# Patient Record
Sex: Male | Born: 1985 | Race: Black or African American | Hispanic: No | Marital: Single | Smoking: Never smoker
Health system: Southern US, Community
[De-identification: ages and names within clinical notes are randomized; demographics above are authoritative.]

## PROBLEM LIST (undated history)

## (undated) DIAGNOSIS — R7303 Prediabetes: Secondary | ICD-10-CM

## (undated) HISTORY — DX: Prediabetes: R73.03

## (undated) HISTORY — PX: STRABISMUS SURGERY: SHX218

---

## 2014-07-02 ENCOUNTER — Emergency Department (HOSPITAL_COMMUNITY): Payer: Self-pay

## 2014-07-02 ENCOUNTER — Emergency Department (HOSPITAL_COMMUNITY)
Admission: EM | Admit: 2014-07-02 | Discharge: 2014-07-02 | Disposition: A | Discharge status: Home / Self Care | Payer: Self-pay | Attending: Emergency Medicine | Admitting: Emergency Medicine

## 2014-07-02 ENCOUNTER — Encounter (HOSPITAL_COMMUNITY): Payer: Self-pay | Admitting: *Deleted

## 2014-07-02 DIAGNOSIS — Y9389 Activity, other specified: Secondary | ICD-10-CM | POA: Insufficient documentation

## 2014-07-02 DIAGNOSIS — S30811A Abrasion of abdominal wall, initial encounter: Secondary | ICD-10-CM | POA: Insufficient documentation

## 2014-07-02 DIAGNOSIS — Y998 Other external cause status: Secondary | ICD-10-CM | POA: Insufficient documentation

## 2014-07-02 DIAGNOSIS — S80211A Abrasion, right knee, initial encounter: Secondary | ICD-10-CM | POA: Insufficient documentation

## 2014-07-02 DIAGNOSIS — Z23 Encounter for immunization: Secondary | ICD-10-CM | POA: Insufficient documentation

## 2014-07-02 DIAGNOSIS — Y92410 Unspecified street and highway as the place of occurrence of the external cause: Secondary | ICD-10-CM | POA: Insufficient documentation

## 2014-07-02 DIAGNOSIS — S0001XA Abrasion of scalp, initial encounter: Secondary | ICD-10-CM | POA: Insufficient documentation

## 2014-07-02 DIAGNOSIS — S60511A Abrasion of right hand, initial encounter: Secondary | ICD-10-CM | POA: Insufficient documentation

## 2014-07-02 DIAGNOSIS — S40211A Abrasion of right shoulder, initial encounter: Secondary | ICD-10-CM | POA: Insufficient documentation

## 2014-07-02 DIAGNOSIS — S50311A Abrasion of right elbow, initial encounter: Secondary | ICD-10-CM | POA: Insufficient documentation

## 2014-07-02 DIAGNOSIS — T148XXA Other injury of unspecified body region, initial encounter: Secondary | ICD-10-CM

## 2014-07-02 MED ORDER — OXYCODONE-ACETAMINOPHEN 5-325 MG PO TABS: 1.0000 | ORAL_TABLET | Freq: Four times a day (QID) | ORAL | Status: DC | PRN

## 2014-07-02 MED ORDER — TETANUS-DIPHTH-ACELL PERTUSSIS 5-2.5-18.5 LF-MCG/0.5 IM SUSP
0.5000 mL | Freq: Once | INTRAMUSCULAR | Status: AC
  Administered 2014-07-02: 0.5 mL via INTRAMUSCULAR
  Filled 2014-07-02: qty 0.5

## 2014-07-02 MED ORDER — OXYCODONE-ACETAMINOPHEN 5-325 MG PO TABS
2.0000 | ORAL_TABLET | Freq: Once | ORAL | Status: AC
  Administered 2014-07-02: 2 via ORAL
  Filled 2014-07-02: qty 2

## 2014-07-02 MED ORDER — KETOROLAC TROMETHAMINE 60 MG/2ML IM SOLN
60.0000 mg | Freq: Once | INTRAMUSCULAR | Status: AC
  Administered 2014-07-02: 60 mg via INTRAMUSCULAR
  Filled 2014-07-02: qty 2

## 2014-07-02 NOTE — Discharge Instructions (Signed)

## 2014-07-02 NOTE — ED Notes (Signed)
Wounds cleaned and irrigated with sterile water and left open to air.
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
## 2014-07-02 NOTE — ED Notes (Signed)
Pt verbalized understanding of prescription use and dressing changes. NO further questions. Pt in NAD.

## 2014-07-02 NOTE — ED Notes (Signed)
All wounds thoroughly cleaned and non adhesive pads with bacitracin applied.

## 2017-12-21 ENCOUNTER — Other Ambulatory Visit: Payer: Self-pay

## 2017-12-21 ENCOUNTER — Encounter (HOSPITAL_COMMUNITY): Payer: Self-pay

## 2017-12-21 ENCOUNTER — Ambulatory Visit (HOSPITAL_COMMUNITY)
Admission: EM | Admit: 2017-12-21 | Discharge: 2017-12-21 | Disposition: A | Discharge status: Home / Self Care | Payer: Self-pay | Attending: Family Medicine | Admitting: Family Medicine

## 2017-12-21 DIAGNOSIS — K2900 Acute gastritis without bleeding: Secondary | ICD-10-CM | POA: Insufficient documentation

## 2017-12-21 DIAGNOSIS — R112 Nausea with vomiting, unspecified: Secondary | ICD-10-CM

## 2017-12-21 MED ORDER — ONDANSETRON HCL 4 MG/2ML IJ SOLN
4.0000 mg | Freq: Once | INTRAMUSCULAR | Status: AC
  Administered 2017-12-21: 4 mg via INTRAMUSCULAR

## 2017-12-21 MED ORDER — ONDANSETRON HCL 4 MG/2ML IJ SOLN
INTRAMUSCULAR | Status: AC
  Filled 2017-12-21: qty 2

## 2017-12-21 MED ORDER — ONDANSETRON 4 MG PO TBDP: 4.0000 mg | ORAL_TABLET | Freq: Three times a day (TID) | ORAL | 0 refills | Status: DC | PRN

## 2017-12-21 NOTE — ED Triage Notes (Signed)
Pt presents today with nausea, vomiting, and abdominal pain. States he did drink alcohol last night but not more than usual. Not able to keep anything on his stomach.

## 2017-12-21 NOTE — ED Provider Notes (Signed)
[facility]    CSN: [id number] Arrival date & time: 12/21/17  1321     History   Chief Complaint Chief Complaint  Patient presents with  . Abdominal Pain    HPI Alan Harding is a 32 y.o. male.   Patient is a 32 year old male who presents for nausea, vomiting start this morning.  His symptoms been constant remain the same.  He has had multiple episodes of bilious vomiting.  No diarrhea.  Denies any fever, chills.  Denies any specific abdominal pain.  He did go to a Christmas party last night where he admits to having a few drinks and eating.  He did not drink more alcohol than usual.  He is unsure if anybody was sick at the party.  No recent traveling.  ROS per HPI      History reviewed. No pertinent past medical history.  There are no active problems to display for this patient.   History reviewed. No pertinent surgical history.     Home Medications    Prior to Admission medications   Medication Sig Start Date End Date Taking? Authorizing Provider  ondansetron (ZOFRAN ODT) 4 MG disintegrating tablet Take 1 tablet (4 mg total) by mouth every 8 (eight) hours as needed for nausea or vomiting. 12/21/17   Janace ArisBast, Arianna Haydon A, NP    Family History No family history on file.  Social History Social History   Tobacco Use  . Smoking status: Never Smoker  . Smokeless tobacco: Never Used  Substance Use Topics  . Alcohol use: Yes  . Drug use: No     Allergies   Patient has no known allergies.   Review of Systems Review of Systems   Physical Exam Triage Vital Signs ED Triage Vitals  Enc Vitals Group     BP 12/21/17 1412 (!) 141/75     Pulse Rate 12/21/17 1412 85     Resp 12/21/17 1412 16     Temp 12/21/17 1412 97.9 F (36.6 C)     Temp Source 12/21/17 1412 Oral     SpO2 12/21/17 1412 98 %     Weight --      Height --      Head Circumference --      Peak Flow --      Pain Score 12/21/17 1410 0     Pain Loc --      Pain Edu? --      Excl.  in [place]? --    No data found.  Updated Vital Signs BP (!) 141/75 (BP Location: Right Arm)   Pulse 85   Temp 97.9 F (36.6 C) (Oral)   Resp 16   SpO2 98%   Visual Acuity Right Eye Distance:   Left Eye Distance:   Bilateral Distance:    Right Eye Near:   Left Eye Near:    Bilateral Near:     Physical Exam Vitals signs and nursing note reviewed.  Constitutional:      Appearance: He is well-developed and normal weight.  HENT:     Head: Normocephalic and atraumatic.  Cardiovascular:     Rate and Rhythm: Normal rate and regular rhythm.  Pulmonary:     Effort: Pulmonary effort is normal.  Abdominal:     General: Abdomen is flat. Bowel sounds are increased.     Palpations: Abdomen is soft. There is no shifting dullness, fluid wave, hepatomegaly, splenomegaly, mass or pulsatile mass.     Tenderness: There is generalized abdominal tenderness.  Skin:    General: Skin is warm and dry.  Neurological:     General: No focal deficit present.     Mental Status: He is alert.  Psychiatric:        Mood and Affect: Mood normal.      UC Treatments / Results  Labs (all labs ordered are listed, but only abnormal results are displayed) Labs Reviewed - No data to display  EKG None  Radiology No results found.  Procedures Procedures (including critical care time)  Medications Ordered in UC Medications  ondansetron (ZOFRAN) injection 4 mg (4 mg Intramuscular Given 12/21/17 1433)    Initial Impression / Assessment and Plan / UC Course  I have reviewed the triage vital signs and the nursing notes.  Pertinent labs & imaging results that were available during my care of the patient were reviewed by me and considered in my medical decision making (see chart for details).     Patient is a 32 year old male who is otherwise healthy.  He went to Christmas party last, had dinner and a few drinks.  Reports that he did not drink more than normal.  Patient is complaining of vague  abdominal pain with nausea, vomiting since this morning.  Zofran 4 mg IM given in clinic 30 minutes later patient reassessed and feeling much better.  Denies any nausea. Most likely symptoms are related to gastritis We will send him home with prescription for Zofran to use as needed Small sips of fluids to stay hydrated Advance diet as tolerated For continued or worsening symptoms please go to the hospital. Final Clinical Impressions(s) / UC Diagnoses   Final diagnoses:  Acute gastritis without hemorrhage, unspecified gastritis type     Discharge Instructions     I am glad that you are feeling better from the medication we gave you Continue to stay hydrated with Gatorade and water. You can use the Zofran as needed for nausea, vomiting For continued or worsening symptoms please follow-up or go to the hospital.    ED Prescriptions    Medication Sig Dispense Auth. Provider   ondansetron (ZOFRAN ODT) 4 MG disintegrating tablet Take 1 tablet (4 mg total) by mouth every 8 (eight) hours as needed for nausea or vomiting. 20 tablet Dahlia Byes A, NP     Controlled Substance Prescriptions [place] Controlled Substance Registry consulted? Not Applicable   Janace Aris, NP 12/22/17 1122

## 2017-12-21 NOTE — Discharge Instructions (Addendum)
I am glad that you are feeling better from the medication we gave you Continue to stay hydrated with Gatorade and water. You can use the Zofran as needed for nausea, vomiting For continued or worsening symptoms please follow-up or go to the hospital.

## 2018-12-21 ENCOUNTER — Other Ambulatory Visit: Payer: Self-pay

## 2018-12-21 ENCOUNTER — Encounter (HOSPITAL_COMMUNITY): Payer: Self-pay | Admitting: Emergency Medicine

## 2018-12-21 ENCOUNTER — Ambulatory Visit (HOSPITAL_COMMUNITY)
Admission: EM | Admit: 2018-12-21 | Discharge: 2018-12-21 | Disposition: A | Discharge status: Home / Self Care | Payer: BC Managed Care – PPO | Attending: Physician Assistant | Admitting: Physician Assistant

## 2018-12-21 DIAGNOSIS — S61012A Laceration without foreign body of left thumb without damage to nail, initial encounter: Secondary | ICD-10-CM

## 2018-12-21 NOTE — ED Provider Notes (Signed)
[facility]    CSN: [id number] Arrival date & time: 12/21/18  1921      History   Chief Complaint Chief Complaint  Patient presents with  . Laceration    left thumb    HPI Alan Harding is a 33 y.o. male.   Patient reports to urgent care today for laceration to left thumb this morning with a box cutter. He denies numbness, tingling or large blood loss. He reports ability to move thumb without issue.      History reviewed. No pertinent past medical history.  There are no problems to display for this patient.   History reviewed. No pertinent surgical history.     Home Medications    Prior to Admission medications   Medication Sig Start Date End Date Taking? Authorizing Provider  ondansetron (ZOFRAN ODT) 4 MG disintegrating tablet Take 1 tablet (4 mg total) by mouth every 8 (eight) hours as needed for nausea or vomiting. 12/21/17   Orvan July, NP    Family History History reviewed. No pertinent family history.  Social History Social History   Tobacco Use  . Smoking status: Never Smoker  . Smokeless tobacco: Never Used  Substance Use Topics  . Alcohol use: Yes  . Drug use: No     Allergies   Patient has no known allergies.   Review of Systems Review of Systems  Constitutional: Negative for fever.  Musculoskeletal: Negative for joint swelling.  Neurological: Negative for dizziness and light-headedness.     Physical Exam Triage Vital Signs ED Triage Vitals [12/21/18 2017]  Enc Vitals Group     BP      Pulse      Resp      Temp      Temp src      SpO2      Weight      Height      Head Circumference      Peak Flow      Pain Score 0     Pain Loc      Pain Edu?      Excl. in [place]?    No data found.  Updated Vital Signs BP (!) 149/91 (BP Location: Right Arm)   Pulse 71   Temp 99 F (37.2 C) (Oral)   Resp 12   SpO2 98%   Visual Acuity Right Eye Distance:   Left Eye Distance:   Bilateral Distance:    Right Eye  Near:   Left Eye Near:    Bilateral Near:     Physical Exam Constitutional:      General: He is not in acute distress.    Appearance: Normal appearance. He is normal weight.  HENT:     Head: Normocephalic and atraumatic.  Eyes:     Pupils: Pupils are equal, round, and reactive to light.  Musculoskeletal:       Arms:     Comments: 1cm x 26mm deep clean lacertation to left thumb. No foreign body present, no sign of tendon involvement. Cap refill <2 seconds. Sensation intact. Full ROM.  Skin:    Capillary Refill: Capillary refill takes less than 2 seconds.  Neurological:     General: No focal deficit present.     Mental Status: He is alert and oriented to person, place, and time.  Psychiatric:        Mood and Affect: Mood normal.        Behavior: Behavior normal.  Thought Content: Thought content normal.        Judgment: Judgment normal.      UC Treatments / Results  Labs (all labs ordered are listed, but only abnormal results are displayed) Labs Reviewed - No data to display  EKG   Radiology No results found.  Procedures Laceration Repair  Date/Time: 12/21/2018 8:21 PM Performed by: Hermelinda Medicus, PA-C Authorized by: Hermelinda Medicus, PA-C   Consent:    Consent obtained:  Verbal   Consent given by:  Patient   Risks discussed:  Infection, pain and poor wound healing   Alternatives discussed:  No treatment Anesthesia (see MAR for exact dosages):    Anesthesia method:  Local infiltration   Local anesthetic:  Lidocaine 2% w/o epi Laceration details:    Location:  Finger   Finger location:  L thumb   Length (cm):  1   Depth (mm):  5 Repair type:    Repair type:  Simple Pre-procedure details:    Preparation:  Patient was prepped and draped in usual sterile fashion Exploration:    Hemostasis achieved with:  Direct pressure   Wound exploration: wound explored through full range of motion     Contaminated: no   Treatment:    Area cleansed with:  Saline    Amount of cleaning:  Standard   Irrigation solution:  Sterile saline   Irrigation volume:  73ml   Irrigation method:  Syringe   Visualized foreign bodies/material removed: no   Skin repair:    Repair method:  Sutures   Suture size:  4-0   Suture material:  Prolene   Suture technique:  Simple interrupted   Number of sutures:  2 Approximation:    Approximation:  Close Post-procedure details:    Dressing:  Antibiotic ointment and bulky dressing   Patient tolerance of procedure:  Tolerated well, no immediate complications   (including critical care time)  Medications Ordered in UC Medications - No data to display  Initial Impression / Assessment and Plan / UC Course  I have reviewed the triage vital signs and the nursing notes.  Pertinent labs & imaging results that were available during my care of the patient were reviewed by me and considered in my medical decision making (see chart for details).     Left Thumb laceration- 1cm x 21mm - no tendon involvement. Good ROM, Neurovascular intact. Simple closure with 2 x simple interrupted 4-0 prolene sutures. Instructed to keep bandage in place x 24 hrs and follow wound care instructions. Return in 7-10 days for removal.  Final Clinical Impressions(s) / UC Diagnoses   Final diagnoses:  Laceration of left thumb without foreign body without damage to nail, initial encounter     Discharge Instructions     Keep your wound/sutures clean and covered for 24 hours. Utilize wound care instructions attached.  Follow up with your primary care provider or return to this clinic if 7-10 days for suture removal.  If you notice redness, discharge, suddenly increasing pain at the wound site or develop a fever seek care at urgent care or an emergency department    ED Prescriptions    None     PDMP not reviewed this encounter.   Hermelinda Medicus, PA-C 12/21/18 [phone]

## 2018-12-21 NOTE — Discharge Instructions (Addendum)
Keep your wound/sutures clean and covered for 24 hours. Utilize wound care instructions attached.  Follow up with your primary care provider or return to this clinic if 7-10 days for suture removal.  If you notice redness, discharge, suddenly increasing pain at the wound site or develop a fever seek care at urgent care or an emergency department

## 2018-12-21 NOTE — ED Triage Notes (Signed)
Pt cut his left thumb with a box cutter at home this morning.  Pt is giving very limited information.

## 2019-03-12 ENCOUNTER — Ambulatory Visit: Payer: BC Managed Care – PPO | Attending: Internal Medicine

## 2019-03-12 DIAGNOSIS — Z23 Encounter for immunization: Secondary | ICD-10-CM | POA: Insufficient documentation

## 2019-03-12 NOTE — Progress Notes (Signed)
   Covid-19 Vaccination Clinic  Name:  Alan Harding    MRN: [id number] DOB: Sep 12, 1985  03/12/2019  Alan Harding was observed post Covid-19 immunization for 15 minutes without incident. He was provided with Vaccine Information Sheet and instruction to access the V-Safe system.   Alan Harding was instructed to call 911 with any severe reactions post vaccine: Marland Kitchen Difficulty breathing  . Swelling of face and throat  . A fast heartbeat  . A bad rash all over body  . Dizziness and weakness

## 2019-04-12 ENCOUNTER — Ambulatory Visit: Payer: BC Managed Care – PPO

## 2019-04-13 ENCOUNTER — Ambulatory Visit: Payer: BC Managed Care – PPO | Attending: Internal Medicine

## 2019-04-13 DIAGNOSIS — Z23 Encounter for immunization: Secondary | ICD-10-CM

## 2019-04-13 NOTE — Progress Notes (Signed)
   Covid-19 Vaccination Clinic  Name:  Cyree Chuong    MRN: [id number] DOB: 07/11/1985  04/13/2019  Mr. Campione was observed post Covid-19 immunization for 15 minutes without incident. He was provided with Vaccine Information Sheet and instruction to access the V-Safe system.   Mr. Fosdick was instructed to call 911 with any severe reactions post vaccine: Marland Kitchen Difficulty breathing  . Swelling of face and throat  . A fast heartbeat  . A bad rash all over body  . Dizziness and weakness   Immunizations Administered    Name Date Dose VIS Date Route   Pfizer COVID-19 Vaccine 04/13/2019 11:28 AM 0.3 mL 12/18/2018 Intramuscular   Manufacturer: [employer], [employer]   Lot: [id number]   NDC: [address]-2

## 2019-11-09 ENCOUNTER — Ambulatory Visit: Payer: BC Managed Care – PPO

## 2019-11-20 ENCOUNTER — Ambulatory Visit: Payer: BC Managed Care – PPO | Attending: Internal Medicine

## 2019-11-20 DIAGNOSIS — Z23 Encounter for immunization: Secondary | ICD-10-CM

## 2019-11-20 NOTE — Progress Notes (Signed)
   Covid-19 Vaccination Clinic  Name:  Alan Harding    MRN: [id number] DOB: [birth year]  11/20/2019  [name] was observed post Covid-19 immunization for 15 minutes without incident. He was provided with Vaccine Information Sheet and instruction to access the V-Safe system.   [name] was instructed to call 911 with any severe reactions post vaccine: Marland Kitchen Difficulty breathing  . Swelling of face and throat  . A fast heartbeat  . A bad rash all over body  . Dizziness and weakness   Immunizations Administered    Name Date Dose VIS Date Route   Pfizer COVID-19 Vaccine 11/20/2019 11:59 AM 0.3 mL 10/27/2019 Intramuscular   Manufacturer: [employer], [employer]   Lot: [id number]   NDC: [address]-3

## 2020-08-28 LAB — HM HEPATITIS C SCREENING LAB: HM Hepatitis Screen: NEGATIVE

## 2021-05-13 DIAGNOSIS — Z7251 High risk heterosexual behavior: Secondary | ICD-10-CM | POA: Diagnosis not present

## 2021-05-13 DIAGNOSIS — Z202 Contact with and (suspected) exposure to infections with a predominantly sexual mode of transmission: Secondary | ICD-10-CM | POA: Diagnosis not present

## 2021-05-15 DIAGNOSIS — Z202 Contact with and (suspected) exposure to infections with a predominantly sexual mode of transmission: Secondary | ICD-10-CM | POA: Diagnosis not present

## 2021-05-15 DIAGNOSIS — Z7251 High risk heterosexual behavior: Secondary | ICD-10-CM | POA: Diagnosis not present

## 2021-05-28 NOTE — Progress Notes (Unsigned)
   New Patient Office Visit  Subjective    Patient ID: Alan Harding, male    DOB: 05-25-1985  Age: 36 y.o. MRN: [id number]  CC: No chief complaint on file.   HPI Alan Harding presents for new patient visit to establish care.  Introduced to Publishing rights manager role and practice setting.  All questions answered.  Discussed provider/patient relationship and expectations.   Outpatient Encounter Medications as of 05/29/2021  Medication Sig   ondansetron (ZOFRAN ODT) 4 MG disintegrating tablet Take 1 tablet (4 mg total) by mouth every 8 (eight) hours as needed for nausea or vomiting.   No facility-administered encounter medications on file as of 05/29/2021.    No past medical history on file.  No past surgical history on file.  No family history on file.  Social History   Socioeconomic History   Marital status: Single    Spouse name: Not on file   Number of children: Not on file   Years of education: Not on file   Highest education level: Not on file  Occupational History   Not on file  Tobacco Use   Smoking status: Never   Smokeless tobacco: Never  Substance and Sexual Activity   Alcohol use: Yes   Drug use: No   Sexual activity: Not on file  Other Topics Concern   Not on file  Social History Narrative   Not on file   Social Determinants of Health   Financial Resource Strain: Not on file  Food Insecurity: Not on file  Transportation Needs: Not on file  Physical Activity: Not on file  Stress: Not on file  Social Connections: Not on file  Intimate Partner Violence: Not on file    ROS      Objective    There were no vitals taken for this visit.  Physical Exam  {Labs (Optional):23779}    Assessment & Plan:   Problem List Items Addressed This Visit   None   No follow-ups on file.   Gerre Scull, NP

## 2021-05-29 ENCOUNTER — Ambulatory Visit: Payer: BC Managed Care – PPO | Admitting: Nurse Practitioner

## 2021-05-29 ENCOUNTER — Encounter: Payer: Self-pay | Admitting: Nurse Practitioner

## 2021-05-29 VITALS — BP 129/86 | HR 74 | Temp 97.7°F | Ht 74.0 in | Wt 183.0 lb

## 2021-05-29 DIAGNOSIS — R202 Paresthesia of skin: Secondary | ICD-10-CM | POA: Diagnosis not present

## 2021-05-29 DIAGNOSIS — R200 Anesthesia of skin: Secondary | ICD-10-CM

## 2021-05-29 DIAGNOSIS — R102 Pelvic and perineal pain: Secondary | ICD-10-CM | POA: Insufficient documentation

## 2021-05-29 LAB — CBC WITH DIFFERENTIAL/PLATELET
Basophils Absolute: 0 10*3/uL (ref 0.0–0.1)
Basophils Relative: 0.3 % (ref 0.0–3.0)
Eosinophils Absolute: 0 10*3/uL (ref 0.0–0.7)
Eosinophils Relative: 0.3 % (ref 0.0–5.0)
HCT: 42.8 % (ref 39.0–52.0)
Hemoglobin: 14.2 g/dL (ref 13.0–17.0)
Lymphocytes Relative: 23.8 % (ref 12.0–46.0)
Lymphs Abs: 1.7 10*3/uL (ref 0.7–4.0)
MCHC: 33.1 g/dL (ref 30.0–36.0)
MCV: 95 fl (ref 78.0–100.0)
Monocytes Absolute: 0.6 10*3/uL (ref 0.1–1.0)
Monocytes Relative: 7.8 % (ref 3.0–12.0)
Neutro Abs: 4.8 10*3/uL (ref 1.4–7.7)
Neutrophils Relative %: 67.8 % (ref 43.0–77.0)
Platelets: 258 10*3/uL (ref 150.0–400.0)
RBC: 4.5 Mil/uL (ref 4.22–5.81)
RDW: 14.5 % (ref 11.5–15.5)
WBC: 7.1 10*3/uL (ref 4.0–10.5)

## 2021-05-29 LAB — LIPID PANEL
Cholesterol: 214 mg/dL — ABNORMAL HIGH (ref 0–200)
HDL: 86.1 mg/dL (ref >39.00)
LDL Cholesterol: 100 mg/dL — ABNORMAL HIGH (ref 0–99)
NonHDL: 128.01
Total CHOL/HDL Ratio: 2
Triglycerides: 139 mg/dL (ref 0.0–149.0)
VLDL: 27.8 mg/dL (ref 0.0–40.0)

## 2021-05-29 LAB — TSH: TSH: 0.9 u[IU]/mL (ref 0.35–5.50)

## 2021-05-29 LAB — COMPREHENSIVE METABOLIC PANEL
ALT: 28 U/L (ref 0–53)
AST: 32 U/L (ref 0–37)
Albumin: 4.6 g/dL (ref 3.5–5.2)
Alkaline Phosphatase: 51 U/L (ref 39–117)
BUN: 18 mg/dL (ref 6–23)
CO2: 31 mEq/L (ref 19–32)
Calcium: 9.8 mg/dL (ref 8.4–10.5)
Chloride: 99 mEq/L (ref 96–112)
Creatinine, Ser: 1.15 mg/dL (ref 0.40–1.50)
GFR: 83.49 mL/min (ref >60.00)
Glucose, Bld: 87 mg/dL (ref 70–99)
Potassium: 4 mEq/L (ref 3.5–5.1)
Sodium: 137 mEq/L (ref 135–145)
Total Bilirubin: 0.6 mg/dL (ref 0.2–1.2)
Total Protein: 7.3 g/dL (ref 6.0–8.3)

## 2021-05-29 LAB — VITAMIN B12: Vitamin B-12: 406 pg/mL (ref 211–911)

## 2021-05-29 LAB — HEMOGLOBIN A1C: Hgb A1c MFr Bld: 6 % (ref 4.6–6.5)
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
## 2021-05-29 NOTE — Assessment & Plan Note (Addendum)
He has been having right groin pain since doing a dead lift a few weeks ago.  He states that the pain has gotten better however he is concerned for hernia.  No hernia palpated on exam, however will order an ultrasound for further evaluation.  Instructed him not to lift anything too heavy right now and he can take Tylenol or ibuprofen as needed for pain.

## 2021-05-29 NOTE — Patient Instructions (Signed)
It was great to see you!  We are checking your labs today and will let you know the results via mychart/phone.   You will get a phone call to schedule an ultrasound  Let's follow-up in 2 weeks, sooner if you have concerns.  If a referral was placed today, you will be contacted for an appointment. Please note that routine referrals can sometimes take up to 3-4 weeks to process. Please call our office if you haven't heard anything after this time frame.  Take care,  Rodman Pickle, NP

## 2021-05-30 LAB — IRON,TIBC AND FERRITIN PANEL
%SAT: 28 % (calc) (ref 20–48)
Ferritin: 76 ng/mL (ref 38–380)
Iron: 90 ug/dL (ref 50–180)
TIBC: 324 mcg/dL (calc) (ref 250–425)

## 2021-05-30 NOTE — Addendum Note (Signed)
Addended by: Rodman Pickle A on: 05/30/2021 11:42 AM   Modules accepted: Orders

## 2021-05-31 ENCOUNTER — Ambulatory Visit
Admission: RE | Admit: 2021-05-31 | Discharge: 2021-05-31 | Disposition: A | Discharge status: Home / Self Care | Payer: BC Managed Care – PPO | Source: Ambulatory Visit | Attending: Nurse Practitioner | Admitting: Nurse Practitioner

## 2021-05-31 DIAGNOSIS — K4090 Unilateral inguinal hernia, without obstruction or gangrene, not specified as recurrent: Secondary | ICD-10-CM | POA: Diagnosis not present

## 2021-05-31 DIAGNOSIS — R102 Pelvic and perineal pain: Secondary | ICD-10-CM

## 2021-05-31 NOTE — Progress Notes (Signed)
Called and informed patient of results and provider instructions. Patient voiced understanding.

## 2021-06-11 ENCOUNTER — Ambulatory Visit (INDEPENDENT_AMBULATORY_CARE_PROVIDER_SITE_OTHER): Payer: BC Managed Care – PPO | Admitting: Diagnostic Neuroimaging

## 2021-06-11 ENCOUNTER — Encounter: Payer: Self-pay | Admitting: Diagnostic Neuroimaging

## 2021-06-11 VITALS — BP 141/76 | HR 81 | Ht 73.0 in | Wt 183.0 lb

## 2021-06-11 DIAGNOSIS — R269 Unspecified abnormalities of gait and mobility: Secondary | ICD-10-CM | POA: Diagnosis not present

## 2021-06-11 DIAGNOSIS — R200 Anesthesia of skin: Secondary | ICD-10-CM

## 2021-06-11 DIAGNOSIS — R202 Paresthesia of skin: Secondary | ICD-10-CM | POA: Diagnosis not present

## 2021-06-11 NOTE — Progress Notes (Signed)
[facility]  PATIENT: Alan Harding DOB: [birth year]  REFERRING CLINICIAN: Gerre Scull, NP HISTORY FROM: new consult  REASON FOR VISIT: new consult   HISTORICAL  CHIEF COMPLAINT:  Chief Complaint  Patient presents with   New Patient (Initial Visit)    [name] reports he is here to discuss worsening numbness tingling in bilateral legs. Reports right is worse than the left and sx have been ongoing now for several months.     HISTORY OF PRESENT ILLNESS:   36 year old male here for evaluation of numbness and tingling.  Symptoms started about 1 year ago.  Initially had some numbness in his right hand and then migrated to left hand.  Then noticed numbness and tingling in the right foot and toes down the left foot.  He describes a fuzzy and thick feeling.  Symptoms radiate up to his legs and groin.  Sometimes has abnormal sensation around his eyes and lips.  He is noted some difficulty with running long distances, feeling fatigue and balance off.  No other triggering aggravating factors.   REVIEW OF SYSTEMS: Full 14 system review of systems performed and negative with exception of: as per HPI.  ALLERGIES: No Known Allergies  HOME MEDICATIONS: No outpatient medications prior to visit.   No facility-administered medications prior to visit.    PAST MEDICAL HISTORY: Past Medical History:  Diagnosis Date   Pre-diabetes     PAST SURGICAL HISTORY: Past Surgical History:  Procedure Laterality Date   STRABISMUS SURGERY      FAMILY HISTORY: Family History  Problem Relation Age of Onset   Fibromyalgia Mother    Diabetes Maternal Grandmother    Cancer Maternal Grandmother        breast    SOCIAL HISTORY: Social History   Socioeconomic History   Marital status: Single    Spouse name: Not on file   Number of children: Not on file   Years of education: Not on file   Highest education level: Not on file  Occupational History   Not on file   Tobacco Use   Smoking status: Never   Smokeless tobacco: Never  Vaping Use   Vaping Use: Never used  Substance and Sexual Activity   Alcohol use: Yes    Comment: occasionally   Drug use: No   Sexual activity: Not on file  Other Topics Concern   Not on file  Social History Narrative   Right handed    Caffeine none     Social Determinants of Health   Financial Resource Strain: Not on file  Food Insecurity: Not on file  Transportation Needs: Not on file  Physical Activity: Not on file  Stress: Not on file  Social Connections: Not on file  Intimate Partner Violence: Not on file     PHYSICAL EXAM  GENERAL EXAM/CONSTITUTIONAL: Vitals:  Vitals:   06/11/21 1129  BP: (!) 141/76  Pulse: 81  Weight: 183 lb (83 kg)  Height: 6\' 1"  (1.854 m)   Body mass index is 24.14 kg/m. Wt Readings from Last 3 Encounters:  06/11/21 183 lb (83 kg)  05/29/21 183 lb (83 kg)  07/02/14 170 lb (77.1 kg)   Patient is in no distress; well developed, nourished and groomed; neck is supple  CARDIOVASCULAR: Examination of carotid arteries is normal; no carotid bruits Regular rate and rhythm, no murmurs Examination of peripheral vascular system by observation and palpation is normal  EYES: Ophthalmoscopic exam of optic discs and posterior segments is  normal; no papilledema or hemorrhages No results found.  MUSCULOSKELETAL: Gait, strength, tone, movements noted in Neurologic exam below  NEUROLOGIC: MENTAL STATUS:      View : No data to display.         awake, alert, oriented to person, place and time recent and remote memory intact normal attention and concentration language fluent, comprehension intact, naming intact fund of knowledge appropriate  CRANIAL NERVE:  2nd - no papilledema on fundoscopic exam 2nd, 3rd, 4th, 6th - pupils equal and reactive to light, visual fields full to confrontation, extraocular muscles intact, no nystagmus 5th - facial sensation symmetric 7th -  facial strength symmetric 8th - hearing intact 9th - palate elevates symmetrically, uvula midline 11th - shoulder shrug symmetric 12th - tongue protrusion midline  MOTOR:  normal bulk and tone, full strength in the BUE, BLE  SENSORY:  normal and symmetric to light touch, temperature, vibration  COORDINATION:  finger-nose-finger, fine finger movements normal  REFLEXES:  deep tendon reflexes TRACE and symmetric  GAIT/STATION:  narrow based gait    DIAGNOSTIC DATA (LABS, IMAGING, TESTING) - I reviewed patient records, labs, notes, testing and imaging myself where available.  Lab Results  Component Value Date   WBC 7.1 05/29/2021   HGB 14.2 05/29/2021   HCT 42.8 05/29/2021   MCV 95.0 05/29/2021   PLT 258.0 05/29/2021      Component Value Date/Time   NA 137 05/29/2021 0939   K 4.0 05/29/2021 0939   CL 99 05/29/2021 0939   CO2 31 05/29/2021 0939   GLUCOSE 87 05/29/2021 0939   BUN 18 05/29/2021 0939   CREATININE 1.15 05/29/2021 0939   CALCIUM 9.8 05/29/2021 0939   PROT 7.3 05/29/2021 0939   ALBUMIN 4.6 05/29/2021 0939   AST 32 05/29/2021 0939   ALT 28 05/29/2021 0939   ALKPHOS 51 05/29/2021 0939   BILITOT 0.6 05/29/2021 0939   Lab Results  Component Value Date   CHOL 214 (H) 05/29/2021   HDL 86.10 05/29/2021   LDLCALC 100 (H) 05/29/2021   TRIG 139.0 05/29/2021   CHOLHDL 2 05/29/2021   Lab Results  Component Value Date   HGBA1C 6.0 05/29/2021   Lab Results  Component Value Date   VITAMINB12 406 05/29/2021   Lab Results  Component Value Date   TSH 0.90 05/29/2021     ASSESSMENT AND PLAN  36 y.o. year old male here with 1 year of abnormal sensations including numbness, tingling, fatigue, balance issues.  We will proceed with further work-up.   Dx:  1. Numbness and tingling   2. Gait difficulty       PLAN:  NUMBNESS (feet, hands, face) + intermittent gait diff + fatigue - check MRI brain (rule out demyelinating disease)  Orders Placed  This Encounter  Procedures   MR BRAIN W WO CONTRAST   Return for pending if symptoms worsen or fail to improve, pending test results.    Suanne Marker, MD 06/11/2021, 12:01 PM Certified in Neurology, Neurophysiology and Neuroimaging  [facility] [address] [place], [place] [address] [phone]

## 2021-06-12 NOTE — Progress Notes (Deleted)
   Established Patient Office Visit  Subjective   Patient ID: Alan Harding, male    DOB: 1985/06/11  Age: 36 y.o. MRN: [id number]  No chief complaint on file.   HPI  Alan Harding is here today to follow-up on numbness and tingling along with right inguinal hernia.   {History (Optional):23778}  ROS    Objective:     There were no vitals taken for this visit. {Vitals History (Optional):23777}  Physical Exam   No results found for any visits on 06/13/21.  {Labs (Optional):23779}  The ASCVD Risk score (Arnett DK, et al., 2019) failed to calculate for the following reasons:   The 2019 ASCVD risk score is only valid for ages 54 to 53    Assessment & Plan:   Problem List Items Addressed This Visit   None   No follow-ups on file.    Gerre Scull, NP

## 2021-06-13 ENCOUNTER — Ambulatory Visit: Payer: BC Managed Care – PPO | Admitting: Nurse Practitioner

## 2021-06-18 ENCOUNTER — Telehealth: Payer: Self-pay | Admitting: Diagnostic Neuroimaging

## 2021-06-18 NOTE — Telephone Encounter (Signed)
45 mins MR brain w/wo contrast Dr. Theresa Mulligan Berkley Harvey: [id number] exp. 06/13/21-08/11/21 scheduled at [facility] 06/19/21 at 9am

## 2021-06-19 ENCOUNTER — Ambulatory Visit: Payer: BC Managed Care – PPO

## 2021-06-19 DIAGNOSIS — R202 Paresthesia of skin: Secondary | ICD-10-CM

## 2021-06-19 DIAGNOSIS — R200 Anesthesia of skin: Secondary | ICD-10-CM | POA: Diagnosis not present

## 2021-06-19 DIAGNOSIS — R269 Unspecified abnormalities of gait and mobility: Secondary | ICD-10-CM

## 2021-06-19 MED ORDER — GADOBENATE DIMEGLUMINE 529 MG/ML IV SOLN
15.0000 mL | Freq: Once | INTRAVENOUS | Status: AC | PRN
  Administered 2021-06-19: 15 mL via INTRAVENOUS

## 2021-06-25 ENCOUNTER — Encounter: Payer: Self-pay | Admitting: Nurse Practitioner

## 2021-06-25 ENCOUNTER — Ambulatory Visit: Payer: BC Managed Care – PPO | Admitting: Nurse Practitioner

## 2021-06-25 VITALS — BP 110/72 | HR 66 | Temp 97.7°F | Wt 182.6 lb

## 2021-06-25 DIAGNOSIS — N4889 Other specified disorders of penis: Secondary | ICD-10-CM

## 2021-06-25 DIAGNOSIS — R202 Paresthesia of skin: Secondary | ICD-10-CM

## 2021-06-25 DIAGNOSIS — R200 Anesthesia of skin: Secondary | ICD-10-CM

## 2021-06-25 MED ORDER — CLOTRIMAZOLE 1 % EX CREA: 1.0000 | TOPICAL_CREAM | Freq: Two times a day (BID) | CUTANEOUS | 0 refills | Status: DC

## 2021-06-25 NOTE — Patient Instructions (Addendum)
It was great to see you!  Let me know if you still have the burning sensation or a rash develops. Keep using the moisturizer.   Let's follow-up in 6 months, sooner if you have concerns.  If a referral was placed today, you will be contacted for an appointment. Please note that routine referrals can sometimes take up to 3-4 weeks to process. Please call our office if you haven't heard anything after this time frame.  Take care,  Rodman Pickle, NP

## 2021-06-25 NOTE — Progress Notes (Signed)
Established Patient Office Visit  Subjective   Patient ID: Alan Harding, male    DOB: 21-Jun-1985  Age: 36 y.o. MRN: [id number]  Chief Complaint  Patient presents with   Follow-up    2 wk f/u    HPI  Alan Harding is here today to follow-up on numbness and tingling in his hands. He went to neurology and has plans for MRI of his head to evaluate for demyelinating disease.  He states that he had the MRI which did not show any cause for the numbness and tingling in his hands and feet.  He states that the tingling and numbness is worse in his feet and his hands.  He feels better now knowing that it is not anything serious causing this.  He also has noticed that he has been having some burning on the skin on his penis intermittently for the past few weeks.  He has been moisturizing it which has been helping.  He states that there has been no changes in his skin, rashes, bumps.  He has been tested for herpes and syphilis which was negative in the past.  He had this happen before and developed molluscum contagiosum.    ROS See pertinent positives and negatives per HPI.    Objective:     BP 110/72   Pulse 66   Temp 97.7 F (36.5 C) (Temporal)   Wt 182 lb 9.6 oz (82.8 kg)   SpO2 99%   BMI 24.09 kg/m    Physical Exam Vitals and nursing note reviewed. Exam conducted with a chaperone present.  Constitutional:      Appearance: Normal appearance.  HENT:     Head: Normocephalic.  Eyes:     Conjunctiva/sclera: Conjunctivae normal.  Cardiovascular:     Rate and Rhythm: Normal rate and regular rhythm.     Pulses: Normal pulses.     Heart sounds: Normal heart sounds.  Pulmonary:     Effort: Pulmonary effort is normal.     Breath sounds: Normal breath sounds.  Genitourinary:    Penis: Normal.   Musculoskeletal:     Cervical back: Normal range of motion.  Skin:    General: Skin is warm.  Neurological:     General: No focal deficit present.     Mental Status: He is alert and oriented  to person, place, and time.  Psychiatric:        Mood and Affect: Mood normal.        Behavior: Behavior normal.        Thought Content: Thought content normal.        Judgment: Judgment normal.      Assessment & Plan:   Problem List Items Addressed This Visit       Other   Numbness and tingling of upper and lower extremities of both sides - Primary    He is still having ongoing numbness and tingling mostly in his feet.  He went to neurology and had an MRI which was negative for anything acute.  He had labs done which were unrevealing for cause of numbness and tingling.  He states that he is doing better now that he knows that it is anything serious.  We will have him follow-up in 6 months.      Irritation of penis    He has been noticing a burning sensation on his penis for the past few weeks intermittently.  He states that this happened before and he developed molluscum contagiosum.  Upon exam  with chaperone, no changes in skin noted to penis.  He can continue using moisturizer or start Lotrimin cream twice a day as needed.  Follow-up if symptoms worsen or if he starts to notice a rash or any changes in skin.       Return in about 6 months (around 12/25/2021) for HLD, prediabetes.    Gerre Scull, NP

## 2021-06-25 NOTE — Assessment & Plan Note (Signed)
He has been noticing a burning sensation on his penis for the past few weeks intermittently.  He states that this happened before and he developed molluscum contagiosum.  Upon exam with chaperone, no changes in skin noted to penis.  He can continue using moisturizer or start Lotrimin cream twice a day as needed.  Follow-up if symptoms worsen or if he starts to notice a rash or any changes in skin.

## 2021-06-25 NOTE — Assessment & Plan Note (Signed)
He is still having ongoing numbness and tingling mostly in his feet.  He went to neurology and had an MRI which was negative for anything acute.  He had labs done which were unrevealing for cause of numbness and tingling.  He states that he is doing better now that he knows that it is anything serious.  We will have him follow-up in 6 months.

## 2021-06-29 ENCOUNTER — Telehealth: Payer: Self-pay

## 2021-07-06 ENCOUNTER — Encounter: Payer: Self-pay | Admitting: Nurse Practitioner

## 2021-07-06 ENCOUNTER — Ambulatory Visit: Payer: BC Managed Care – PPO | Admitting: Nurse Practitioner

## 2021-07-06 VITALS — BP 124/82 | HR 67 | Temp 99.6°F | Wt 179.2 lb

## 2021-07-06 DIAGNOSIS — J029 Acute pharyngitis, unspecified: Secondary | ICD-10-CM | POA: Diagnosis not present

## 2021-07-06 LAB — POCT RAPID STREP A (OFFICE): Rapid Strep A Screen: NEGATIVE

## 2021-07-06 MED ORDER — AMOXICILLIN-POT CLAVULANATE 875-125 MG PO TABS: 1.0000 | ORAL_TABLET | Freq: Two times a day (BID) | ORAL | 0 refills | Status: DC

## 2021-07-06 MED ORDER — LIDOCAINE VISCOUS HCL 2 % MT SOLN: 5.0000 mL | Freq: Four times a day (QID) | OROMUCOSAL | 0 refills | Status: DC | PRN

## 2021-07-06 NOTE — Assessment & Plan Note (Signed)
Rapid strep negative, with low-grade fever, swollen lymph nodes, exudate we will treat with Augmentin twice daily x10 days.  Magic mouthwash prn.  He has been having recurrent sore throat about every 3 months for the past year.  We will place referral to ENT for further evaluation.  Follow-up if symptoms not improving.

## 2021-07-06 NOTE — Progress Notes (Signed)
Acute Office Visit  Subjective:     Patient ID: Alan Harding, male    DOB: [birth year], 36 y.o.   MRN: [id number]  Chief Complaint  Patient presents with   Sore Throat    Pt c/o sore//irritating throat, headache, and nausea for several days.     HPI Patient is in today for sore throat and headache for the past 2 weeks. He took some zyrtec which helped, and then got worse. Since August 2022, he has been having his throat hurting every 3-4 months. He has had negative strep tests.   UPPER RESPIRATORY TRACT INFECTION  Fever: yes Cough: no Shortness of breath: no Wheezing: no Chest pain: no Chest tightness: no Chest congestion: no Nasal congestion: no Runny nose: no Post nasal drip: yes Sneezing: no Sore throat: yes Swollen glands: yes Sinus pressure: no Headache: yes Face pain: no Toothache: no Ear pain: yes left Ear pressure: no bilateral Eyes red/itching:no Eye drainage/crusting: no  Vomiting: no Nausea: yes Rash: no Fatigue: yes Sick contacts: yes Strep contacts: no  Context: worse Recurrent sinusitis: no Relief with OTC cold/cough medications: no  Treatments attempted: zyrtec, neti pot, gargling warm salt water, advil, sudafed, nasal spray  ROS See pertinent positives and negatives per HPI.    Objective:    BP 124/82   Pulse 67   Temp 99.6 F (37.6 C) (Oral)   Wt 179 lb 3.2 oz (81.3 kg)   SpO2 98%   BMI 23.64 kg/m    Physical Exam Vitals and nursing note reviewed.  Constitutional:      General: He is not in acute distress.    Appearance: Normal appearance.  HENT:     Head: Normocephalic.     Right Ear: Tympanic membrane, ear canal and external ear normal.     Left Ear: Tympanic membrane, ear canal and external ear normal.     Mouth/Throat:     Pharynx: Oropharyngeal exudate and posterior oropharyngeal erythema present.     Tonsils: Tonsillar exudate present. 2+ on the right. 3+ on the left.  Eyes:     Conjunctiva/sclera: Conjunctivae  normal.  Cardiovascular:     Rate and Rhythm: Normal rate and regular rhythm.     Pulses: Normal pulses.     Heart sounds: Normal heart sounds.  Pulmonary:     Effort: Pulmonary effort is normal.     Breath sounds: Normal breath sounds.  Musculoskeletal:     Cervical back: Normal range of motion. Tenderness present.  Lymphadenopathy:     Cervical: Cervical adenopathy present.  Skin:    General: Skin is warm.  Neurological:     General: No focal deficit present.     Mental Status: He is alert and oriented to person, place, and time.  Psychiatric:        Mood and Affect: Mood normal.        Behavior: Behavior normal.        Thought Content: Thought content normal.        Judgment: Judgment normal.     Results for orders placed or performed in visit on 07/06/21  POCT rapid strep A  Result Value Ref Range   Rapid Strep A Screen Negative Negative        Assessment & Plan:   Problem List Items Addressed This Visit       Other   Sore throat - Primary    Rapid strep negative, with low-grade fever, swollen lymph nodes, exudate we will treat with Augmentin  twice daily x10 days.  Magic mouthwash prn.  He has been having recurrent sore throat about every 3 months for the past year.  We will place referral to ENT for further evaluation.  Follow-up if symptoms not improving.      Relevant Orders   POCT rapid strep A (Completed)   Ambulatory referral to ENT    Meds ordered this encounter  Medications   amoxicillin-clavulanate (AUGMENTIN) 875-125 MG tablet    Sig: Take 1 tablet by mouth 2 (two) times daily.    Dispense:  20 tablet    Refill:  0   magic mouthwash (lidocaine, diphenhydrAMINE, alum & mag hydroxide) suspension    Sig: Swish and swallow 5 mLs 4 (four) times daily as needed for mouth pain.    Dispense:  360 mL    Refill:  0    Return if symptoms worsen or fail to improve.  Gerre Scull, NP

## 2021-07-06 NOTE — Patient Instructions (Addendum)
It was great to see you!  Start augmentin antibiotic 1 tablet twice a day with food for 10 days. I sent in magic mouthwash to gargle and swallow 4 times a day as needed for sore throat. You can continue the over the counter medicines you are taking.  I have placed a referral to ENT for your recurrent sore throats.    Let's follow-up if your symptoms worsen or don't improve.  Take care,  Rodman Pickle, NP

## 2021-07-16 ENCOUNTER — Ambulatory Visit: Payer: BC Managed Care – PPO | Admitting: Diagnostic Neuroimaging

## 2023-04-22 ENCOUNTER — Encounter: Admitting: Nurse Practitioner

## 2023-04-22 ENCOUNTER — Telehealth: Payer: Self-pay | Admitting: Nurse Practitioner

## 2023-04-28 ENCOUNTER — Encounter: Admitting: Nurse Practitioner

## 2023-05-20 NOTE — Telephone Encounter (Signed)
 04/22/2023 1st no show, letter sent via mychart

## 2023-07-15 IMAGING — US US PELVIS LIMITED
1 series · 9 of 9 positions shown · non-contrast
Comparison: None Available.

CLINICAL DATA: inguinal pain

EXAM:
LIMITED ULTRASOUND OF PELVIS
TECHNIQUE: Limited transabdominal ultrasound examination of the pelvis was
performed.

[Series 1: us pelvis limited · 0.11mm/px · 9 acquisitions, 9 frames shown]
[im 1/9]
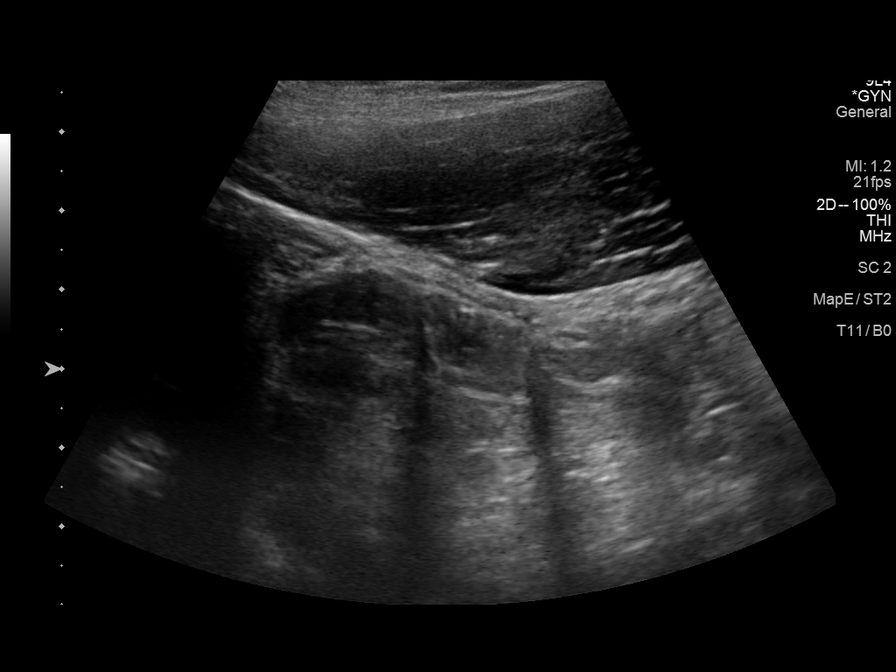
[im 2/9]
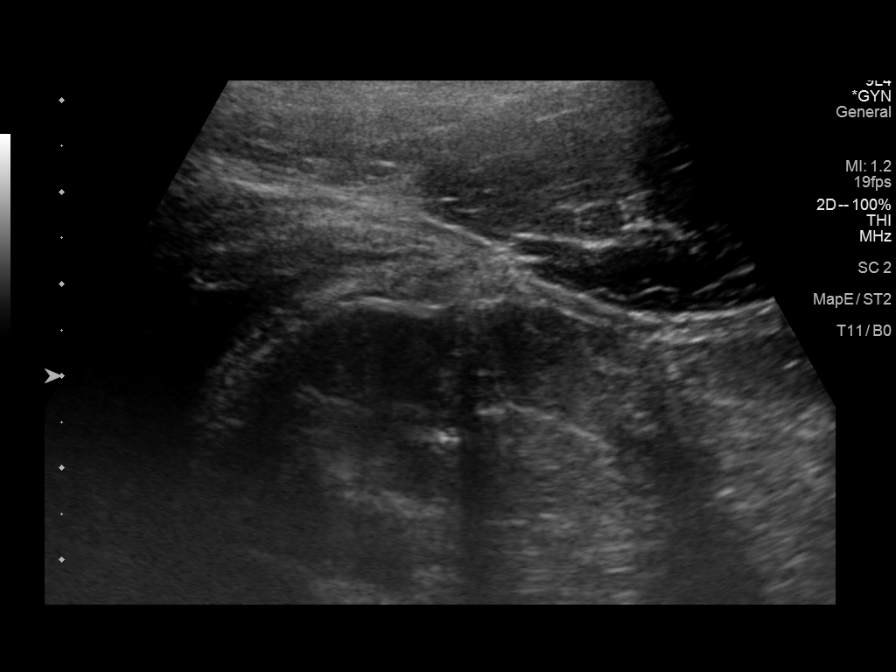
[im 3/9]
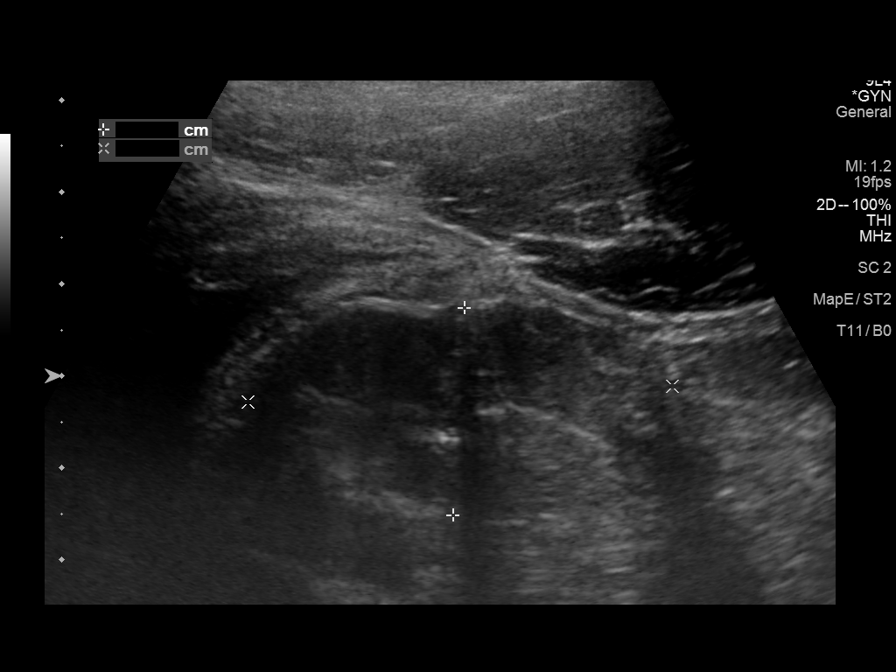
[im 4/9]
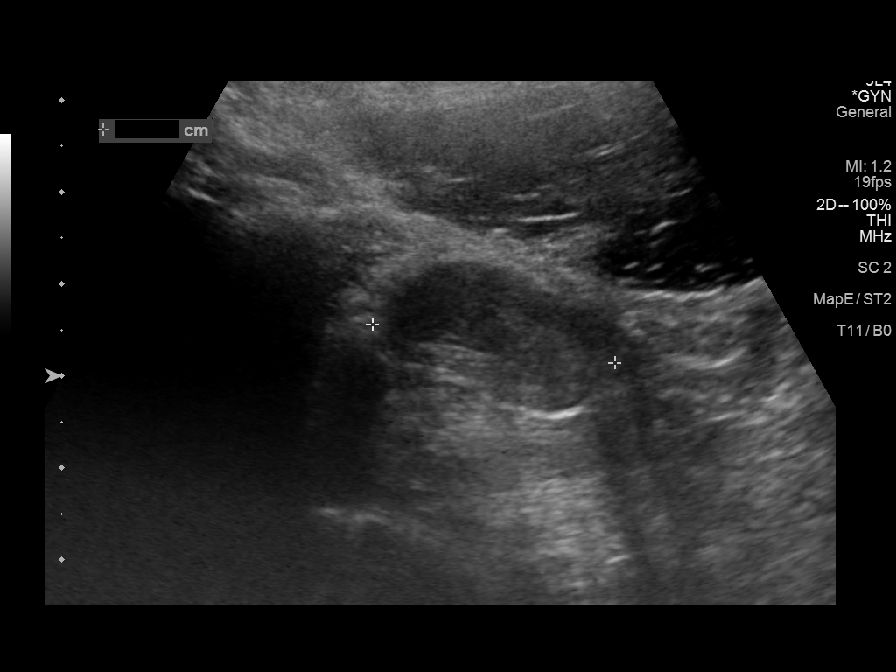
[im 5/9]
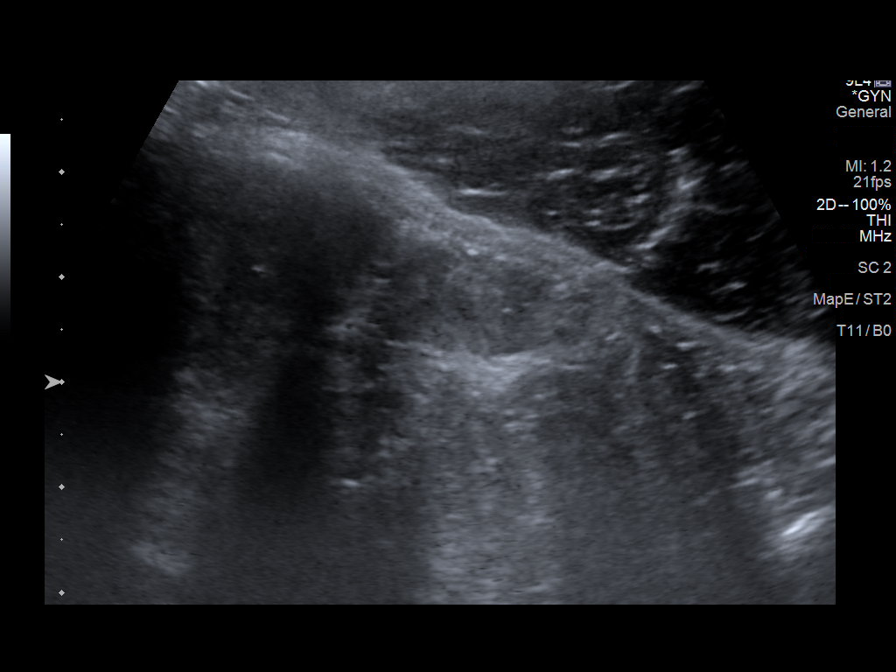
[im 6/9]
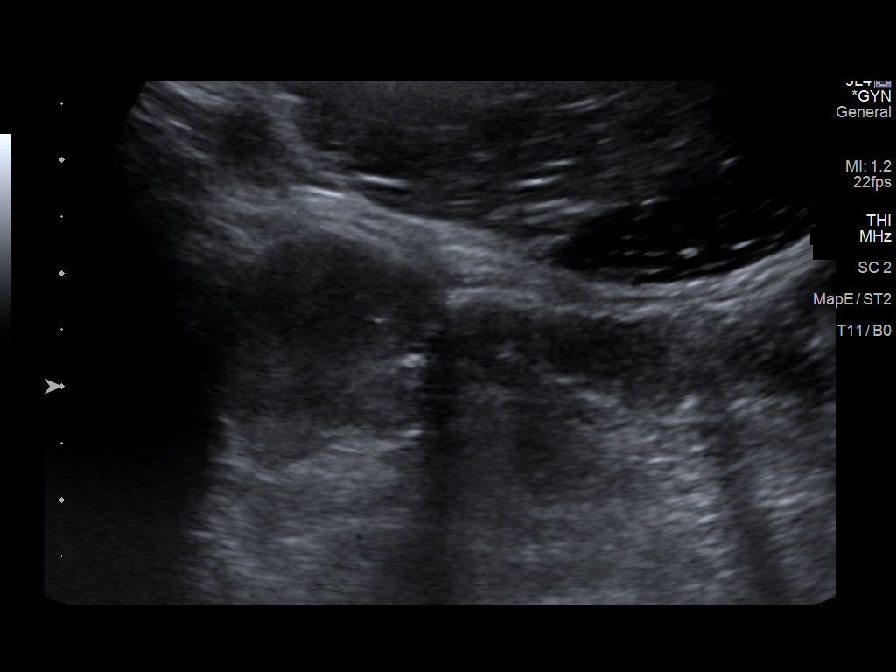
[im 7/9]
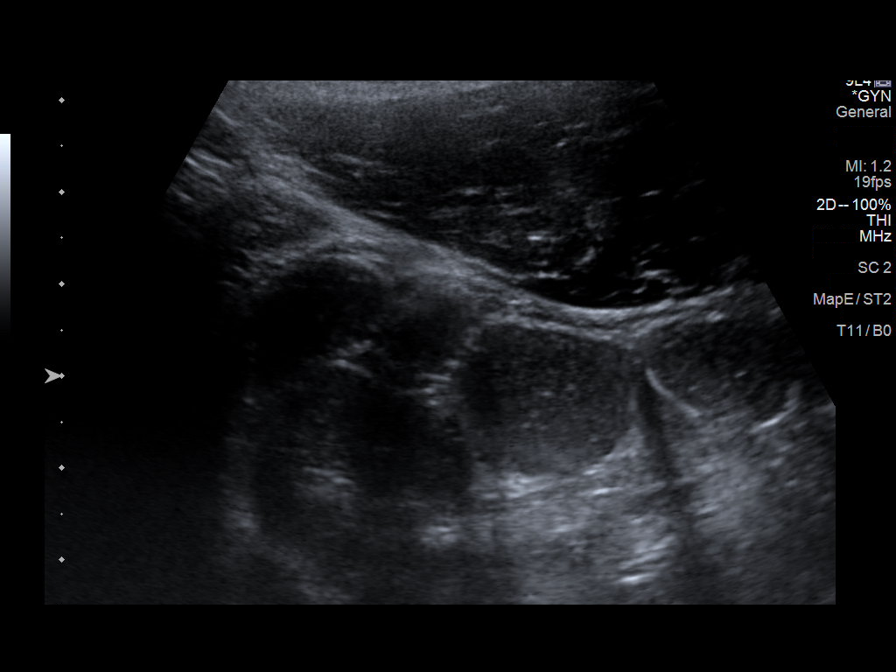
[im 8/9]
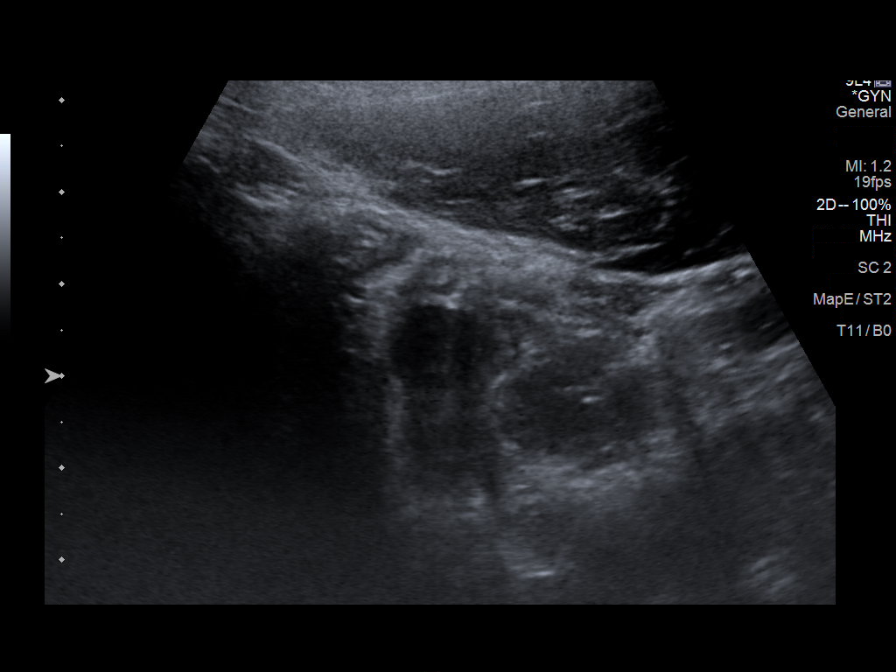
[im 9/9]
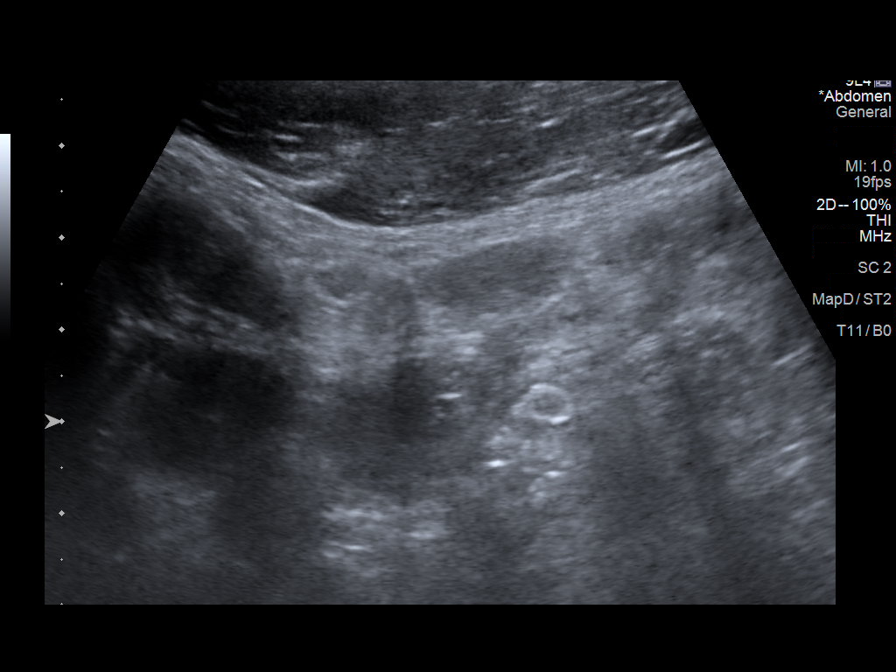

[9 of 9 positions shown; findings below may reference images not displayed]

FINDINGS: Targeted ultrasound was performed of the RIGHT inguinal region.
There is a bowel containing RIGHT inguinal hernia. This increases
with Valsalva maneuver. Images demonstrate calipers on this bowel
containing hernia which measures 2. 3 x 4.6 x 2.7 cm at a
representative point in time.
IMPRESSION: Bowel containing RIGHT inguinal hernia.

## 2023-08-01 ENCOUNTER — Ambulatory Visit (INDEPENDENT_AMBULATORY_CARE_PROVIDER_SITE_OTHER): Admitting: Nurse Practitioner

## 2023-08-01 ENCOUNTER — Encounter: Payer: Self-pay | Admitting: Nurse Practitioner

## 2023-08-01 VITALS — BP 124/82 | HR 70 | Temp 97.4°F | Ht 74.0 in | Wt 197.6 lb

## 2023-08-01 DIAGNOSIS — R0781 Pleurodynia: Secondary | ICD-10-CM | POA: Diagnosis not present

## 2023-08-01 DIAGNOSIS — E7800 Pure hypercholesterolemia, unspecified: Secondary | ICD-10-CM

## 2023-08-01 DIAGNOSIS — L309 Dermatitis, unspecified: Secondary | ICD-10-CM

## 2023-08-01 DIAGNOSIS — R7303 Prediabetes: Secondary | ICD-10-CM | POA: Diagnosis not present

## 2023-08-01 DIAGNOSIS — Z0000 Encounter for general adult medical examination without abnormal findings: Secondary | ICD-10-CM | POA: Diagnosis not present

## 2023-08-01 MED ORDER — TRIAMCINOLONE ACETONIDE 0.1 % EX CREA: 1.0000 | TOPICAL_CREAM | Freq: Two times a day (BID) | CUTANEOUS | 0 refills | Status: AC

## 2023-08-01 NOTE — Assessment & Plan Note (Signed)
Health maintenance reviewed and updated. Discussed nutrition, exercise. Follow-up 1 year.

## 2023-08-01 NOTE — Patient Instructions (Addendum)
 It was great to see you!  We are checking your labs today and will let you know the results via mychart/phone.   Let's check an xray of your right ribs - you can walk in Monday - Friday 8am-5pm  You can use over the counter voltaren gel as needed for pain.  Start triamcinolone cream twice a day as needed for the itchy bumps  Let's follow-up in 1 year, sooner if you have concerns.  If a referral was placed today, you will be contacted for an appointment. Please note that routine referrals can sometimes take up to 3-4 weeks to process. Please call our office if you haven't heard anything after this time frame.  Take care,  Tinnie Harada, NP

## 2023-08-01 NOTE — Assessment & Plan Note (Signed)
 Chronic, stable. Last A1c was 6%. Check A1c today and treat based on results.

## 2023-08-01 NOTE — Assessment & Plan Note (Signed)
 Start triamcinolone cream to affected areas twice daily as needed.

## 2023-08-01 NOTE — Progress Notes (Signed)
 BP 124/82 (BP Location: Left Arm, Patient Position: Sitting, Cuff Size: Normal)   Pulse 70   Temp (!) 97.4 F (36.3 C)   Ht 6' 2 (1.88 m)   Wt 197 lb 9.6 oz (89.6 kg)   SpO2 97%   BMI 25.37 kg/m    Subjective:    Patient ID: Alan Harding, male    DOB: 1985/01/22, 38 y.o.   MRN: [id number]  CC: Chief Complaint  Patient presents with   Annual Exam    With lab work-patient is fasting, no concerns    HPI: Alan Harding is a 39 y.o. male presenting on 08/01/2023 for comprehensive medical examination. Current medical complaints include:right side pain  He has been experiencing right sided rib pain for the last year. He does not remember any injury. He states that it is more of a discomfort when he is laying on his side or if he bumps into it. He denies bruising. He has not tried any OTC treatments.   He currently lives with: wife  Depression and Anxiety Screen done today and results listed below:     08/01/2023    3:40 PM 05/29/2021    9:52 AM  Depression screen PHQ 2/9  Decreased Interest 0 0  Down, Depressed, Hopeless 0 0  PHQ - 2 Score 0 0  Altered sleeping 0 3  Tired, decreased energy 0 0  Change in appetite 0 0  Feeling bad or failure about yourself  0 0  Trouble concentrating 0 3  Moving slowly or fidgety/restless 0 1  Suicidal thoughts 0 0  PHQ-9 Score 0 7  Difficult doing work/chores Not difficult at all Somewhat difficult      08/01/2023    3:40 PM 05/29/2021    9:52 AM  GAD 7 : Generalized Anxiety Score  Nervous, Anxious, on Edge 0 3  Control/stop worrying 0 0  Worry too much - different things 0 0  Trouble relaxing 0 3  Restless 0 3  Easily annoyed or irritable 0 0  Afraid - awful might happen 0 0  Total GAD 7 Score 0 9  Anxiety Difficulty Not difficult at all Somewhat difficult    The patient does not have a history of falls. I did not complete a risk assessment for falls. A plan of care for falls was not documented.   Past Medical History:   Past Medical History:  Diagnosis Date   Pre-diabetes     Surgical History:  Past Surgical History:  Procedure Laterality Date   STRABISMUS SURGERY      Medications:  Current Outpatient Medications on File Prior to Visit  Medication Sig   clotrimazole  (LOTRIMIN  AF) 1 % cream Apply 1 Application topically 2 (two) times daily. (Patient not taking: Reported on 08/01/2023)   etodolac (LODINE) 400 MG tablet 1 tablet (Patient not taking: Reported on 08/01/2023)   fluorometholone (FML) 0.1 % ophthalmic suspension SMARTSIG:In Eye(s) (Patient not taking: Reported on 08/01/2023)   gatifloxacin (ZYMAXID) 0.5 % SOLN SMARTSIG:In Eye(s) (Patient not taking: Reported on 08/01/2023)   No current facility-administered medications on file prior to visit.    Allergies:  No Known Allergies  Social History:  Social History   Socioeconomic History   Marital status: Single    Spouse name: Not on file   Number of children: Not on file   Years of education: Not on file   Highest education level: Not on file  Occupational History   Not on file  Tobacco Use  Smoking status: Never   Smokeless tobacco: Never  Vaping Use   Vaping status: Never Used  Substance and Sexual Activity   Alcohol use: Yes    Comment: occasionally   Drug use: No   Sexual activity: Not on file  Other Topics Concern   Not on file  Social History Narrative   Right handed    Caffeine none     Social Drivers of Health   Financial Resource Strain: Not on file  Food Insecurity: Not on file  Transportation Needs: Not on file  Physical Activity: Not on file  Stress: Not on file  Social Connections: Not on file  Intimate Partner Violence: Not on file   Social History   Tobacco Use  Smoking Status Never  Smokeless Tobacco Never   Social History   Substance and Sexual Activity  Alcohol Use Yes   Comment: occasionally    Family History:  Family History  Problem Relation Age of Onset   Fibromyalgia Mother     Cancer Father        prostate   Diabetes Maternal Grandmother    Cancer Maternal Grandmother        breast    Past medical history, surgical history, medications, allergies, family history and social history reviewed with patient today and changes made to appropriate areas of the chart.   Review of Systems  Constitutional: Negative.   HENT: Negative.    Eyes: Negative.   Respiratory: Negative.    Cardiovascular: Negative.   Gastrointestinal: Negative.   Genitourinary: Negative.   Musculoskeletal:  Positive for joint pain (right rib pain).  Skin:  Positive for rash (small, itchy bumps to arm, stomach, leg).  Neurological: Negative.   Psychiatric/Behavioral: Negative.     All other ROS negative except what is listed above and in the HPI.      Objective:    BP 124/82 (BP Location: Left Arm, Patient Position: Sitting, Cuff Size: Normal)   Pulse 70   Temp (!) 97.4 F (36.3 C)   Ht 6' 2 (1.88 m)   Wt 197 lb 9.6 oz (89.6 kg)   SpO2 97%   BMI 25.37 kg/m   Wt Readings from Last 3 Encounters:  08/01/23 197 lb 9.6 oz (89.6 kg)  07/06/21 179 lb 3.2 oz (81.3 kg)  06/25/21 182 lb 9.6 oz (82.8 kg)    Physical Exam Vitals and nursing note reviewed.  Constitutional:      General: He is not in acute distress.    Appearance: Normal appearance.  HENT:     Head: Normocephalic and atraumatic.     Right Ear: Tympanic membrane, ear canal and external ear normal.     Left Ear: Tympanic membrane, ear canal and external ear normal.     Mouth/Throat:     Mouth: Mucous membranes are moist.     Pharynx: No posterior oropharyngeal erythema.  Eyes:     Conjunctiva/sclera: Conjunctivae normal.  Cardiovascular:     Rate and Rhythm: Normal rate and regular rhythm.     Pulses: Normal pulses.     Heart sounds: Normal heart sounds.  Pulmonary:     Effort: Pulmonary effort is normal.     Breath sounds: Normal breath sounds.  Abdominal:     Palpations: Abdomen is soft.     Tenderness: There  is no abdominal tenderness.  Musculoskeletal:        General: Tenderness (right rib) present. Normal range of motion.     Cervical back: Normal range  of motion and neck supple. No tenderness.     Right lower leg: No edema.     Left lower leg: No edema.  Lymphadenopathy:     Cervical: No cervical adenopathy.  Skin:    General: Skin is warm and dry.     Comments: Small, bumps to right forearm and abdomen   Neurological:     General: No focal deficit present.     Mental Status: He is alert and oriented to person, place, and time.     Cranial Nerves: No cranial nerve deficit.     Coordination: Coordination normal.     Gait: Gait normal.  Psychiatric:        Mood and Affect: Mood normal.        Behavior: Behavior normal.        Thought Content: Thought content normal.        Judgment: Judgment normal.     Results for orders placed or performed in visit on 07/06/21  POCT rapid strep A   Collection Time: 07/06/21  4:23 PM  Result Value Ref Range   Rapid Strep A Screen Negative Negative      Assessment & Plan:   Problem List Items Addressed This Visit       Musculoskeletal and Integument   Eczema   Start triamcinolone cream to affected areas twice daily as needed.       Relevant Medications   triamcinolone cream (KENALOG) 0.1 %     Other   Routine general medical examination at a health care facility - Primary   Health maintenance reviewed and updated. Discussed nutrition, exercise. Follow-up 1 year.        Prediabetes   Chronic, stable. Last A1c was 6%. Check A1c today and treat based on results.       Relevant Orders   Hemoglobin A1c   Pure hypercholesterolemia   Chronic, stable. Last LDL slightly elevated at 100. Check CMP, CBC, lipid panel today.       Relevant Orders   CBC with Differential/Platelet   Comprehensive metabolic panel with GFR   Lipid panel   Rib pain on right side   Rib pain and tenderness x1 year. Will check x-ray of ribs. Can use  voltaren gel 4x daily as needed. Follow-up based on imaging.       Relevant Orders   DG Ribs Unilateral Right    IMMUNIZATIONS:   - Tdap: Tetanus vaccination status reviewed: last tetanus booster within 10 years. - Influenza: Postponed to flu season - Pneumovax: Not applicable - Prevnar: Not applicable - HPV: Not applicable - Shingrix vaccine: Not applicable  SCREENING: - Colonoscopy: Not applicable  Discussed with patient purpose of the colonoscopy is to detect colon cancer at curable precancerous or early stages   - AAA Screening: Not applicable   PATIENT COUNSELING:    Sexuality: Discussed sexually transmitted diseases, partner selection, use of condoms, avoidance of unintended pregnancy  and contraceptive alternatives.   Advised to avoid cigarette smoking.  I discussed with the patient that most people either abstain from alcohol or drink within safe limits (<=14/week and <=4 drinks/occasion for males, <=7/weeks and <= 3 drinks/occasion for females) and that the risk for alcohol disorders and other health effects rises proportionally with the number of drinks per week and how often a drinker exceeds daily limits.  Discussed cessation/primary prevention of drug use and availability of treatment for abuse.   Diet: Encouraged to adjust caloric intake to maintain  or achieve ideal  body weight, to reduce intake of dietary saturated fat and total fat, to limit sodium intake by avoiding high sodium foods and not adding table salt, and to maintain adequate dietary potassium and calcium preferably from fresh fruits, vegetables, and low-fat dairy products.    stressed the importance of regular exercise  Injury prevention: Discussed safety belts, safety helmets, smoke detector, smoking near bedding or upholstery.   Dental health: Discussed importance of regular tooth brushing, flossing, and dental visits.   Follow up plan: NEXT PREVENTATIVE PHYSICAL DUE IN 1 YEAR. Return in about 1  year (around 07/31/2024) for CPE.  Cheryl Chay A Alan Harding

## 2023-08-01 NOTE — Assessment & Plan Note (Signed)
 Chronic, stable. Last LDL slightly elevated at 100. Check CMP, CBC, lipid panel today.

## 2023-08-01 NOTE — Assessment & Plan Note (Signed)
 Rib pain and tenderness x1 year. Will check x-ray of ribs. Can use voltaren gel 4x daily as needed. Follow-up based on imaging.

## 2023-08-02 LAB — CBC WITH DIFFERENTIAL/PLATELET
Absolute Lymphocytes: 2946 {cells}/uL (ref 850–3900)
Absolute Monocytes: 635 {cells}/uL (ref 200–950)
Basophils Absolute: 7 {cells}/uL (ref 0–200)
Basophils Relative: 0.1 %
Eosinophils Absolute: 83 {cells}/uL (ref 15–500)
Eosinophils Relative: 1.2 %
HCT: 40.1 % (ref 38.5–50.0)
Hemoglobin: 12.9 g/dL — ABNORMAL LOW (ref 13.2–17.1)
MCH: 31 pg (ref 27.0–33.0)
MCHC: 32.2 g/dL (ref 32.0–36.0)
MCV: 96.4 fL (ref 80.0–100.0)
MPV: 10.6 fL (ref 7.5–12.5)
Monocytes Relative: 9.2 %
Neutro Abs: 3229 {cells}/uL (ref 1500–7800)
Neutrophils Relative %: 46.8 %
Platelets: 261 Thousand/uL (ref 140–400)
RBC: 4.16 Million/uL — ABNORMAL LOW (ref 4.20–5.80)
RDW: 13.8 % (ref 11.0–15.0)
Total Lymphocyte: 42.7 %
WBC: 6.9 Thousand/uL (ref 3.8–10.8)

## 2023-08-02 LAB — COMPREHENSIVE METABOLIC PANEL WITH GFR
AG Ratio: 1.9 (calc) (ref 1.0–2.5)
ALT: 32 U/L (ref 9–46)
AST: 48 U/L — ABNORMAL HIGH (ref 10–40)
Albumin: 4.4 g/dL (ref 3.6–5.1)
Alkaline phosphatase (APISO): 51 U/L (ref 36–130)
BUN: 12 mg/dL (ref 7–25)
CO2: 28 mmol/L (ref 20–32)
Calcium: 9.3 mg/dL (ref 8.6–10.3)
Chloride: 102 mmol/L (ref 98–110)
Creat: 0.92 mg/dL (ref 0.60–1.26)
Globulin: 2.3 g/dL (ref 1.9–3.7)
Glucose, Bld: 88 mg/dL (ref 65–99)
Potassium: 4.1 mmol/L (ref 3.5–5.3)
Sodium: 139 mmol/L (ref 135–146)
Total Bilirubin: 0.6 mg/dL (ref 0.2–1.2)
Total Protein: 6.7 g/dL (ref 6.1–8.1)
eGFR: 111 mL/min/1.73m2 (ref >=60)

## 2023-08-02 LAB — LIPID PANEL
Cholesterol: 200 mg/dL — ABNORMAL HIGH (ref <200)
HDL: 89 mg/dL (ref >=40)
LDL Cholesterol (Calc): 98 mg/dL
Non-HDL Cholesterol (Calc): 111 mg/dL (ref <130)
Total CHOL/HDL Ratio: 2.2 (calc) (ref <5.0)
Triglycerides: 50 mg/dL (ref <150)

## 2023-08-02 LAB — HEMOGLOBIN A1C
Hgb A1c MFr Bld: 6.2 % — ABNORMAL HIGH (ref <5.7)
Mean Plasma Glucose: 131 mg/dL
eAG (mmol/L): 7.3 mmol/L

## 2023-08-04 ENCOUNTER — Ambulatory Visit: Payer: Self-pay | Admitting: Nurse Practitioner

## 2023-08-11 ENCOUNTER — Ambulatory Visit (INDEPENDENT_AMBULATORY_CARE_PROVIDER_SITE_OTHER)

## 2023-08-11 DIAGNOSIS — R0781 Pleurodynia: Secondary | ICD-10-CM | POA: Diagnosis not present

## 2023-08-13 ENCOUNTER — Encounter: Admitting: Nurse Practitioner

## 2023-08-19 ENCOUNTER — Encounter: Payer: Self-pay | Admitting: Nurse Practitioner

## 2023-08-20 ENCOUNTER — Other Ambulatory Visit: Payer: Self-pay | Admitting: Medical Genetics

## 2023-10-23 ENCOUNTER — Other Ambulatory Visit: Payer: Self-pay | Admitting: Medical Genetics

## 2023-10-23 DIAGNOSIS — Z006 Encounter for examination for normal comparison and control in clinical research program: Secondary | ICD-10-CM

## 2023-12-18 ENCOUNTER — Ambulatory Visit: Payer: Self-pay

## 2023-12-18 ENCOUNTER — Telehealth: Payer: Self-pay | Admitting: Nurse Practitioner

## 2023-12-18 NOTE — Telephone Encounter (Signed)
 noted

## 2023-12-18 NOTE — Telephone Encounter (Signed)
 FYI: This call has been transferred to triage nurse: the Triage Nurse. Once the result note has been entered staff can address the message at that time.  Patient called in with the following symptoms:  Red Word:dizziness , pt scheduled via mychart for 12/24 with Lauren. I called pt.    Please advise at [facility] [phone]  Message is routed to Provider Pool.

## 2023-12-18 NOTE — Telephone Encounter (Signed)
 FYI Only or Action Required?: FYI only for provider: appointment scheduled on 12/23/2023 at 3:20 PM.  Patient was last seen in primary care on 08/01/2023 by Alan Tinnie LABOR, NP.  Called Nurse Triage reporting Dizziness.  Symptoms began week of Thanksgiving.  Interventions attempted: Rest, hydration, or home remedies.  Symptoms are: unchanged.  Triage Disposition: See PCP When Office is Open (Within 3 Days)  Patient/caregiver understands and will follow disposition?: Yes  Reason for Disposition  [1] MILD dizziness (e.g., walking normally) AND [2] has NOT been evaluated by doctor (or NP/PA) for this  (Exception: Dizziness caused by heat exposure, sudden standing, or poor fluid intake.)  Answer Assessment - Initial Assessment Questions Patient was transferred by [facility] for triage. Reports lightheadedness and nausea. Patient states the symptoms aren't all the time. Scheduled to see another provider on 12/23/2023 at 3:20 PM  1. DESCRIPTION: Describe your dizziness.     lightheaded 2. LIGHTHEADED: Do you feel lightheaded? (e.g., somewhat faint, woozy, weak upon standing)     yes 3. VERTIGO: Do you feel like either you or the room is spinning or tilting? (i.e., vertigo)     no 4. SEVERITY: How bad is it?  Do you feel like you are going to faint? Can you stand and walk?     Mild to moderate 5. ONSET:  When did the dizziness begin?     Started week of thanksgiving 6. AGGRAVATING FACTORS: Does anything make it worse? (e.g., standing, change in head position)     No issues 7. HEART RATE: Can you tell me your heart rate? How many beats in 15 seconds?  (Note: Not all patients can do this.)       NA 8. CAUSE: What do you think is causing the dizziness? (e.g., decreased fluids or food, diarrhea, emotional distress, heat exposure, new medicine, sudden standing, vomiting; unknown)     unsure 9. RECURRENT SYMPTOM: Have you had dizziness before? If Yes, ask:  When was the last time? What happened that time?     yes 10. OTHER SYMPTOMS: Do you have any other symptoms? (e.g., fever, chest pain, vomiting, diarrhea, bleeding)       Nausea,  Protocols used: Dizziness - Lightheadedness-A-AH

## 2023-12-23 ENCOUNTER — Ambulatory Visit: Admitting: Family Medicine

## 2023-12-23 ENCOUNTER — Encounter: Payer: Self-pay | Admitting: Family Medicine

## 2023-12-23 VITALS — BP 124/72 | HR 60 | Ht 74.0 in | Wt 188.4 lb

## 2023-12-23 DIAGNOSIS — R42 Dizziness and giddiness: Secondary | ICD-10-CM

## 2023-12-23 DIAGNOSIS — Z6824 Body mass index (BMI) 24.0-24.9, adult: Secondary | ICD-10-CM | POA: Diagnosis not present

## 2023-12-23 DIAGNOSIS — R7303 Prediabetes: Secondary | ICD-10-CM | POA: Diagnosis not present

## 2023-12-23 DIAGNOSIS — R7401 Elevation of levels of liver transaminase levels: Secondary | ICD-10-CM

## 2023-12-23 DIAGNOSIS — Z833 Family history of diabetes mellitus: Secondary | ICD-10-CM | POA: Diagnosis not present

## 2023-12-24 LAB — CBC WITH DIFFERENTIAL/PLATELET
Basophils Absolute: 0 K/uL (ref 0.0–0.1)
Basophils Relative: 0.7 % (ref 0.0–3.0)
Eosinophils Absolute: 0.1 K/uL (ref 0.0–0.7)
Eosinophils Relative: 1.5 % (ref 0.0–5.0)
HCT: 43.2 % (ref 39.0–52.0)
Hemoglobin: 14.5 g/dL (ref 13.0–17.0)
Lymphocytes Relative: 47.2 % — ABNORMAL HIGH (ref 12.0–46.0)
Lymphs Abs: 2.6 K/uL (ref 0.7–4.0)
MCHC: 33.6 g/dL (ref 30.0–36.0)
MCV: 92.2 fl (ref 78.0–100.0)
Monocytes Absolute: 0.6 K/uL (ref 0.1–1.0)
Monocytes Relative: 11.6 % (ref 3.0–12.0)
Neutro Abs: 2.2 K/uL (ref 1.4–7.7)
Neutrophils Relative %: 39 % — ABNORMAL LOW (ref 43.0–77.0)
Platelets: 267 K/uL (ref 150.0–400.0)
RBC: 4.68 Mil/uL (ref 4.22–5.81)
RDW: 14.4 % (ref 11.5–15.5)
WBC: 5.6 K/uL (ref 4.0–10.5)

## 2023-12-24 LAB — COMPREHENSIVE METABOLIC PANEL WITH GFR
ALT: 33 U/L (ref 3–53)
AST: 44 U/L — ABNORMAL HIGH (ref 5–37)
Albumin: 4.7 g/dL (ref 3.5–5.2)
Alkaline Phosphatase: 52 U/L (ref 39–117)
BUN: 12 mg/dL (ref 6–23)
CO2: 32 meq/L (ref 19–32)
Calcium: 10 mg/dL (ref 8.4–10.5)
Chloride: 99 meq/L (ref 96–112)
Creatinine, Ser: 1.04 mg/dL (ref 0.40–1.50)
GFR: 92.51 mL/min (ref >60.00)
Glucose, Bld: 86 mg/dL (ref 70–99)
Potassium: 4.2 meq/L (ref 3.5–5.1)
Sodium: 139 meq/L (ref 135–145)
Total Bilirubin: 0.6 mg/dL (ref 0.2–1.2)
Total Protein: 7.5 g/dL (ref 6.0–8.3)

## 2023-12-24 NOTE — Progress Notes (Unsigned)
 Diagnoses and Orders:   1. Lightheaded    No orders of the defined types were placed in this encounter.  Orders Placed This Encounter  Procedures   Comp Met (CMET)   CBC with Differential/Platelet   Assessment & Plan:   Assessment and Plan Assessment & Plan Evaluation of fatigue and nausea in the context of prediabetes Fatigue and nausea with symptoms suggestive of glycemic fluctuations related to prediabetes. Differential includes hypoglycemia or hyperglycemia. - Ordered point of care finger stick A1c test. - If A1c indicates diabetes, prescribe CGM such as [employer]. - If A1c does not indicate diabetes, consider Skyla for non-diabetic glucose monitoring.  Prediabetes Family history of diabetes. Previous A1c was 6.2. Reports 10-pound weight loss. Engages in regular physical activity. Symptoms may relate to prediabetes. - Ordered A1c test to evaluate prediabetes status. - Discussed potential use of CGM for blood glucose pattern monitoring.    Geni Shutter, DO, MS, FAAFP, Dipl. [facility] [address] [place], [address] Dept: [phone] Dept Fax: [phone]  Subjective:   History of Present Illness Alan Harding is a 38 year old male with prediabetes who presents with symptoms suggestive of blood sugar issues.  Episodes of weakness and near-syncope - Two weeks of recurrent episodes of extreme weakness, grogginess, diaphoresis, and near-syncope - First episode occurred after drinking orange juice, requiring 15 minutes of rest for improvement - Symptoms persist despite dietary interventions, including sweets and peanut butter - Episodes are intermittent and can occur without exercise or as early as shortly after waking - No improvement after eating a full meal in the morning  Nausea and hangover sensation - Persistent nausea and constant 'hangover' sensation for two weeks - No vomiting; able to eat  normal amounts but remains nauseated - Symptoms not relieved by eating - Symptoms significantly impair concentration at work and sometimes require lying down  Exercise tolerance and post-exercise symptoms - Runs 3 miles and works out about 2 hours daily - Feels well during exercise but may develop symptoms 1-2 hours afterward  Gastrointestinal changes - More frequent, smaller bowel movements - No abdominal pain or vomiting  Associated symptoms and negative review of systems - No vision changes - No increased urination  Weight loss and metabolic history - Lost approximately 10 pounds since last A1c measurement - Current weight is 183 lb - Last A1c was 6.2 - Strong family history of diabetes  Review of Systems: Negative, with the exception of above mentioned in HPI.  Medications:   Show/hide medication list[1]  Objective:   BP 124/72 (BP Location: Left Arm, Cuff Size: Large)   Pulse 60   Ht 6' 2 (1.88 m)   Wt 188 lb 6.4 oz (85.5 kg)   SpO2 98%   BMI 24.19 kg/m   Physical Exam  Attestations:   {EW NEW PT ATTESTATIONS:34266}    [1]  Outpatient Medications Prior to Visit  Medication Sig   triamcinolone  cream (KENALOG ) 0.1 % Apply 1 Application topically 2 (two) times daily.   [DISCONTINUED] clotrimazole  (LOTRIMIN  AF) 1 % cream Apply 1 Application topically 2 (two) times daily. (Patient not taking: Reported on 08/01/2023)   [DISCONTINUED] etodolac (LODINE) 400 MG tablet 1 tablet (Patient not taking: Reported on 08/01/2023)   [DISCONTINUED] fluorometholone (FML) 0.1 % ophthalmic suspension SMARTSIG:In Eye(s) (Patient not taking: Reported on 08/01/2023)   [DISCONTINUED] gatifloxacin (ZYMAXID) 0.5 % SOLN SMARTSIG:In Eye(s) (Patient not taking: Reported on 08/01/2023)   No facility-administered medications prior  to visit.

## 2023-12-24 NOTE — Patient Instructions (Signed)
[email or web address]

## 2023-12-26 DIAGNOSIS — Z833 Family history of diabetes mellitus: Secondary | ICD-10-CM | POA: Insufficient documentation

## 2023-12-26 DIAGNOSIS — Z6824 Body mass index (BMI) 24.0-24.9, adult: Secondary | ICD-10-CM | POA: Insufficient documentation

## 2023-12-28 ENCOUNTER — Encounter: Payer: Self-pay | Admitting: Family Medicine

## 2023-12-31 ENCOUNTER — Ambulatory Visit: Admitting: Nurse Practitioner
